# Patient Record
Sex: Male | Born: 1965 | Race: White | Hispanic: No | Marital: Married | Smoking: Never smoker
Health system: Southern US, Community
[De-identification: ages and names within clinical notes are randomized; demographics above are authoritative.]

## PROBLEM LIST (undated history)

## (undated) DIAGNOSIS — Z87442 Personal history of urinary calculi: Secondary | ICD-10-CM

## (undated) DIAGNOSIS — K649 Unspecified hemorrhoids: Secondary | ICD-10-CM

## (undated) DIAGNOSIS — M109 Gout, unspecified: Secondary | ICD-10-CM

## (undated) DIAGNOSIS — I8290 Acute embolism and thrombosis of unspecified vein: Secondary | ICD-10-CM

## (undated) HISTORY — DX: Unspecified hemorrhoids: K64.9

## (undated) HISTORY — DX: Gout, unspecified: M10.9

## (undated) HISTORY — DX: Acute embolism and thrombosis of unspecified vein: I82.90

---

## 2020-04-08 ENCOUNTER — Other Ambulatory Visit: Payer: Self-pay | Admitting: Oncology

## 2020-04-08 DIAGNOSIS — U071 COVID-19: Secondary | ICD-10-CM

## 2020-04-08 NOTE — Progress Notes (Signed)
I connected by phone with Mr. Demonte to discuss the potential use of an new treatment for mild to moderate COVID-19 viral infection in non-hospitalized patients.   This patient is a age/sex that meets the FDA criteria for Emergency Use Authorization of casirivimab\imdevimab.  Has a (+) direct SARS-CoV-2 viral test result 1. Has mild or moderate COVID-19  2. Is ? 54 years of age and weighs ? 40 kg 3. Is NOT hospitalized due to COVID-19 4. Is NOT requiring oxygen therapy or requiring an increase in baseline oxygen flow rate due to COVID-19 5. Is within 10 days of symptom onset 6. Has at least one of the high risk factor(s) for progression to severe COVID-19 and/or hospitalization as defined in EUA. ? Specific high risk criteria :No past medical history on file. ? HIgh risk- Obesity    Symptom onset  04/07/2020   I have spoken and communicated the following to the patient or parent/caregiver:   1. FDA has authorized the emergency use of casirivimab\imdevimab for the treatment of mild to moderate COVID-19 in adults and pediatric patients with positive results of direct SARS-CoV-2 viral testing who are 68 years of age and older weighing at least 40 kg, and who are at high risk for progressing to severe COVID-19 and/or hospitalization.   2. The significant known and potential risks and benefits of casirivimab\imdevimab, and the extent to which such potential risks and benefits are unknown.   3. Information on available alternative treatments and the risks and benefits of those alternatives, including clinical trials.   4. Patients treated with casirivimab\imdevimab should continue to self-isolate and use infection control measures (e.g., wear mask, isolate, social distance, avoid sharing personal items, clean and disinfect "high touch" surfaces, and frequent handwashing) according to CDC guidelines.    5. The patient or parent/caregiver has the option to accept or refuse casirivimab\imdevimab .    After reviewing this information with the patient, The patient agreed to proceed with receiving casirivimab\imdevimab infusion and will be provided a copy of the Fact sheet prior to receiving the infusion.Mignon Pine, AGNP-C [phone] (Infusion Center Hotline)

## 2020-04-09 ENCOUNTER — Other Ambulatory Visit (HOSPITAL_COMMUNITY): Payer: Self-pay

## 2020-04-09 ENCOUNTER — Ambulatory Visit (HOSPITAL_COMMUNITY)
Admission: RE | Admit: 2020-04-09 | Discharge: 2020-04-09 | Disposition: A | Discharge status: Home / Self Care | Payer: HRSA Program | Source: Ambulatory Visit | Attending: Pulmonary Disease | Admitting: Pulmonary Disease

## 2020-04-09 DIAGNOSIS — U071 COVID-19: Secondary | ICD-10-CM | POA: Diagnosis not present

## 2020-04-09 MED ORDER — DIPHENHYDRAMINE HCL 50 MG/ML IJ SOLN: 50.0000 mg | Freq: Once | INTRAMUSCULAR | Status: DC | PRN

## 2020-04-09 MED ORDER — FAMOTIDINE IN NACL 20-0.9 MG/50ML-% IV SOLN: 20.0000 mg | Freq: Once | INTRAVENOUS | Status: DC | PRN

## 2020-04-09 MED ORDER — METHYLPREDNISOLONE SODIUM SUCC 125 MG IJ SOLR: 125.0000 mg | Freq: Once | INTRAMUSCULAR | Status: DC | PRN

## 2020-04-09 MED ORDER — SODIUM CHLORIDE 0.9 % IV SOLN: INTRAVENOUS | Status: DC | PRN

## 2020-04-09 MED ORDER — SODIUM CHLORIDE 0.9 % IV SOLN
1200.0000 mg | Freq: Once | INTRAVENOUS | Status: AC
  Administered 2020-04-09: 1200 mg via INTRAVENOUS

## 2020-04-09 MED ORDER — EPINEPHRINE 0.3 MG/0.3ML IJ SOAJ: 0.3000 mg | Freq: Once | INTRAMUSCULAR | Status: DC | PRN

## 2020-04-09 MED ORDER — ALBUTEROL SULFATE HFA 108 (90 BASE) MCG/ACT IN AERS: 2.0000 | INHALATION_SPRAY | Freq: Once | RESPIRATORY_TRACT | Status: DC | PRN

## 2020-04-09 NOTE — Discharge Instructions (Signed)

## 2020-04-09 NOTE — Progress Notes (Signed)
  Diagnosis: COVID-19  Physician: Dr. Patrick Wright  Procedure: [facility] Med: casirivimab\imdevimab infusion - Provided patient with casirivimab\imdevimab fact sheet for patients, parents and caregivers prior to infusion.  Complications: No immediate complications noted.  Discharge: Discharged home   Ally Yow 04/09/2020   

## 2020-10-01 ENCOUNTER — Other Ambulatory Visit (HOSPITAL_COMMUNITY): Payer: Self-pay | Admitting: Family Medicine

## 2020-10-01 ENCOUNTER — Ambulatory Visit (HOSPITAL_COMMUNITY)
Admission: RE | Admit: 2020-10-01 | Discharge: 2020-10-01 | Disposition: A | Discharge status: Home / Self Care | Payer: Self-pay | Source: Ambulatory Visit | Attending: Family Medicine | Admitting: Family Medicine

## 2020-10-01 ENCOUNTER — Other Ambulatory Visit: Payer: Self-pay

## 2020-10-01 DIAGNOSIS — R609 Edema, unspecified: Secondary | ICD-10-CM

## 2020-10-01 NOTE — Progress Notes (Signed)
Lower extremity venous RT study completed.  Preliminary results relayed to Herbert Marshall. Patient instructed to return to provider office.  See CV Proc for preliminary results report.   Jean Rosenthal, RDMS, RVT

## 2020-10-20 ENCOUNTER — Telehealth: Payer: Self-pay | Admitting: Oncology

## 2020-10-20 NOTE — Telephone Encounter (Signed)
Patient referred by Terrilyn Saver, NP for DVT (Possible Lupus).  Appt made for 11/11/20 Consult 11:00 am

## 2020-11-10 NOTE — Progress Notes (Signed)
[facility]  [address] [place],  [place]  [address] [phone]  Clinic Day:  11/11/2020  Referring physician: [facility]   HISTORY OF PRESENT ILLNESS:  The patient is a 55 y.o. male  who I was asked to consult upon for a right lower extremity DVT.  The patient claims that he noticed sudden right leg swelling that came pronounced over 2-3 days.  This led to a Doppler ultrasound being done in March 2022, which showed clot in his right femoral, proximal profunda femoral, popliteal and gastrocnemius veins.  This led to him being placed on Xarelto, which he continues to take.  He denies having a personal history of clots.  He denies having any trauma or previous surgery to his right leg.  He denies any recent extended travel.  He denies there being a family history of blood clots.  He cannot identify any particular reason as to why he developed a right lower extremity DVT.  PAST MEDICAL HISTORY:   Past Medical History:  Diagnosis Date  . Blood clot in vein   . Gout   . Hemorrhoid     PAST SURGICAL HISTORY:  Left ankle and knee surgeries  CURRENT MEDICATIONS:   Current Outpatient Medications  Medication Sig Dispense Refill  . Colchicine 0.6 MG CAPS Take 1 capsule by mouth daily.    . febuxostat (ULORIC) 40 MG tablet Take 40 mg by mouth daily.    . indomethacin (INDOCIN) 50 MG capsule Take 50 mg by mouth 3 (three) times daily.    Carlena Hurl 20 MG TABS tablet SMARTSIG:1 Tablet(s) By Mouth Every Evening     No current facility-administered medications for this visit.    ALLERGIES:  No Known Allergies  FAMILY HISTORY:  His mother died from an acute cardiovascular event.  His maternal grandfather had lung cancer.  SOCIAL HISTORY:  He was born in [place], [place].  He lives in the [place] community with his wife of 22 years.  He has 2 children and 1 grandchild.  He has been a Conservator, museum/gallery for 7 years.  He drinks socially on the  weekends.  He denies tobacco abuse.    REVIEW OF SYSTEMS:  Review of Systems  Constitutional: Negative for fatigue, fever and unexpected weight change.  Respiratory: Negative for chest tightness, cough, hemoptysis and shortness of breath.   Cardiovascular: Negative for chest pain and palpitations.  Gastrointestinal: Positive for blood in stool (attributed to hemorrhoids). Negative for abdominal distention, abdominal pain, constipation, diarrhea, nausea and vomiting.  Genitourinary: Negative for dysuria, frequency and hematuria.   Musculoskeletal: Positive for arthralgias (arms, neck, back). Negative for back pain and myalgias.  Skin: Positive for rash (right groin). Negative for itching.  Neurological: Negative for dizziness, headaches and light-headedness.  Psychiatric/Behavioral: Negative for depression and suicidal ideas. The patient is not nervous/anxious.      PHYSICAL EXAM:  Blood pressure (!) 170/109, pulse 71, temperature 98.5 F (36.9 C), resp. rate 18, height 6\' 9"  (2.057 m), weight (!) 323 lb 11.2 oz (146.8 kg), SpO2 95 %. Wt Readings from Last 3 Encounters:  11/11/20 (!) 323 lb 11.2 oz (146.8 kg)   Body mass index is 34.69 kg/m. Performance status (ECOG): 0 Physical Exam Constitutional:      Appearance: Normal appearance. He is not ill-appearing.  HENT:     Mouth/Throat:     Mouth: Mucous membranes are moist.     Pharynx: Oropharynx is clear. No oropharyngeal exudate or posterior oropharyngeal  erythema.  Cardiovascular:     Rate and Rhythm: Normal rate and regular rhythm.     Heart sounds: No murmur heard. No friction rub. No gallop.   Pulmonary:     Effort: Pulmonary effort is normal. No respiratory distress.     Breath sounds: Normal breath sounds. No wheezing, rhonchi or rales.  Chest:  Breasts:     Right: No axillary adenopathy or supraclavicular adenopathy.     Left: No axillary adenopathy or supraclavicular adenopathy.    Abdominal:     General: Bowel  sounds are normal. There is no distension.     Palpations: Abdomen is soft. There is no mass.     Tenderness: There is no abdominal tenderness.  Musculoskeletal:        General: No swelling.     Right lower leg: 1+ Edema present.     Left lower leg: No edema.  Lymphadenopathy:     Cervical: No cervical adenopathy.     Upper Body:     Right upper body: No supraclavicular or axillary adenopathy.     Left upper body: No supraclavicular or axillary adenopathy.     Lower Body: No right inguinal adenopathy. No left inguinal adenopathy.  Skin:    General: Skin is warm.     Coloration: Skin is not jaundiced.     Findings: No lesion or rash.  Neurological:     General: No focal deficit present.     Mental Status: He is alert and oriented to person, place, and time. Mental status is at baseline.     Cranial Nerves: Cranial nerves are intact.  Psychiatric:        Mood and Affect: Mood normal.        Behavior: Behavior normal.        Thought Content: Thought content normal.     ASSESSMENT & PLAN:  A 55 y.o. male who I was asked to consult upon for an unprovoked DVT.  A partial hypercoagulable workup has been done, which showed him not to have the factor V Leiden mutation.  His antithrombin III level also came back normal.  A full protein C and S panel was not done. It will be done today.  Prothrombin gene mutation testing will be done today. He did test positive for a lupus inhibitor, which raises the suspicion for antiphospholipid syndrome.  This test can be abnormal when taking Xarelto, but the patient claims he was not on this medicine when his testing was done.  With respect to antiphospholipid syndrome, there are 3 tests used to evaluate for this disorder:  Beta-2 glycoproteins antibodies, anti-Cardiolipin antibodies, and the lupus anticoagulant screen.  All 3 tests will be checked today.  Tests for this disorder have to be positive on 2 separate occasions at least 12 weeks apart from one  another in order for the diagnosis to be made.  He knows to stay on his Xarelto for now.  I will see him back in 3 weeks to go over the results of his hypercoagulable workup.  The patient understands all the plans discussed today and is in agreement with them.  I do appreciate [facility] for his new consult.   Roey Coopman Kirby Funk, MD

## 2020-11-11 ENCOUNTER — Inpatient Hospital Stay: Payer: 59 | Attending: Oncology | Admitting: Oncology

## 2020-11-11 ENCOUNTER — Other Ambulatory Visit: Payer: Self-pay | Admitting: Oncology

## 2020-11-11 ENCOUNTER — Encounter: Payer: Self-pay | Admitting: Oncology

## 2020-11-11 ENCOUNTER — Inpatient Hospital Stay: Payer: 59

## 2020-11-11 ENCOUNTER — Other Ambulatory Visit: Payer: Self-pay

## 2020-11-11 DIAGNOSIS — Z7901 Long term (current) use of anticoagulants: Secondary | ICD-10-CM | POA: Insufficient documentation

## 2020-11-11 DIAGNOSIS — I82411 Acute embolism and thrombosis of right femoral vein: Secondary | ICD-10-CM | POA: Diagnosis present

## 2020-11-11 DIAGNOSIS — I824Y1 Acute embolism and thrombosis of unspecified deep veins of right proximal lower extremity: Secondary | ICD-10-CM

## 2020-11-11 DIAGNOSIS — I82401 Acute embolism and thrombosis of unspecified deep veins of right lower extremity: Secondary | ICD-10-CM | POA: Insufficient documentation

## 2020-11-11 LAB — ANTITHROMBIN III: AntiThromb III Func: 100 % (ref 75–120)

## 2020-11-11 NOTE — Progress Notes (Signed)
Spoke with patient, he was asking about any assistance for his Xarelto. Applied him for the Xarelto co-pay card, he was approved, he is going to stop by and get the information to take to his pharmacy.

## 2020-11-11 NOTE — Progress Notes (Unsigned)
Spoke with Leonie Man @ [facility]) [phone]; authorized CPT [phone] FACTOR V for 1 visit. OACZ#[id number] 11/11/2020 - 02/08/2021

## 2020-11-12 LAB — PROTEIN S, ANTIGEN, FREE: Protein S Ag, Free: 135 % (ref 61–136)

## 2020-11-12 LAB — LUPUS ANTICOAGULANT PANEL
DRVVT: 62.4 s — ABNORMAL HIGH (ref 0.0–47.0)
PTT Lupus Anticoagulant: 34.4 s (ref 0.0–51.9)

## 2020-11-12 LAB — DRVVT MIX: dRVVT Mix: 47.7 s — ABNORMAL HIGH (ref 0.0–40.4)

## 2020-11-12 LAB — PROTEIN S, TOTAL: Protein S Ag, Total: 89 % (ref 60–150)

## 2020-11-12 LAB — DRVVT CONFIRM: dRVVT Confirm: 1.4 ratio — ABNORMAL HIGH (ref 0.8–1.2)

## 2020-11-12 LAB — PROTEIN C ACTIVITY: Protein C Activity: 130 % (ref 73–180)

## 2020-11-12 LAB — PROTEIN S ACTIVITY: Protein S Activity: 131 % (ref 63–140)

## 2020-11-13 LAB — PROTEIN C, TOTAL: Protein C, Total: 104 % (ref 60–150)

## 2020-11-17 LAB — FACTOR 5 LEIDEN

## 2020-11-19 LAB — PROTHROMBIN GENE MUTATION

## 2020-11-28 NOTE — Progress Notes (Signed)
[facility]  [address] [place],  [place]  [address] [phone]  Clinic Day:  12/02/2020  Referring physician: [facility]  This document serves as a record of services personally performed by Weston Settle, MD. It was created on their behalf by Curry,Lauren E, a trained medical scribe. The creation of this record is based on the scribe's personal observations and the provider's statements to them.  HISTORY OF PRESENT ILLNESS:  The patient is a 55 y.o. male  who recently developed an unprovoked right lower extremity DVT in March 2022.  He comes in today to go over his hypercoagulable workup to determine if any underlying clotting disorder is present.  Since his last visit, the patient has been doing well.  However, he does have persistent swelling in his right leg  PHYSICAL EXAM:  Blood pressure (!) 163/102, pulse 78, temperature 98 F (36.7 C), resp. rate 18, height 6\' 9"  (2.057 m), weight (!) 332 lb 4.8 oz (150.7 kg), SpO2 98 %. Wt Readings from Last 3 Encounters:  12/02/20 (!) 332 lb 4.8 oz (150.7 kg)  11/11/20 (!) 323 lb 11.2 oz (146.8 kg)   Body mass index is 35.61 kg/m. Performance status (ECOG): 0 Physical Exam Constitutional:      Appearance: Normal appearance. He is not ill-appearing.  HENT:     Mouth/Throat:     Mouth: Mucous membranes are moist.     Pharynx: Oropharynx is clear. No oropharyngeal exudate or posterior oropharyngeal erythema.  Cardiovascular:     Rate and Rhythm: Normal rate and regular rhythm.     Heart sounds: No murmur heard. No friction rub. No gallop.   Pulmonary:     Effort: Pulmonary effort is normal. No respiratory distress.     Breath sounds: Normal breath sounds. No wheezing, rhonchi or rales.  Chest:  Breasts:     Right: No axillary adenopathy or supraclavicular adenopathy.     Left: No axillary adenopathy or supraclavicular adenopathy.    Abdominal:     General: Bowel sounds are  normal. There is no distension.     Palpations: Abdomen is soft. There is no mass.     Tenderness: There is no abdominal tenderness.  Musculoskeletal:        General: No swelling.     Right lower leg: 1+ Edema (trace ) present.     Left lower leg: No edema.  Lymphadenopathy:     Cervical: No cervical adenopathy.     Upper Body:     Right upper body: No supraclavicular or axillary adenopathy.     Left upper body: No supraclavicular or axillary adenopathy.     Lower Body: No right inguinal adenopathy. No left inguinal adenopathy.  Skin:    General: Skin is warm.     Coloration: Skin is not jaundiced.     Findings: Burn (suntan/sunburn over all extremities) present. No lesion or rash.  Neurological:     General: No focal deficit present.     Mental Status: He is alert and oriented to person, place, and time. Mental status is at baseline.     Cranial Nerves: Cranial nerves are intact.  Psychiatric:        Mood and Affect: Mood normal.        Behavior: Behavior normal.        Thought Content: Thought content normal.    LABS:   Ref Range & Units 2 wk ago  Protein S Ag, Total 60 -  150 % 89     Ref Range & Units 2 wk ago  Protein S Ag, Free 61 - 136 % 135     Ref Range & Units 2 wk ago  Protein S Activity 63 - 140 % 131       Ref Range & Units 2 wk ago  Protein C, Total 60 - 150 % 104     Ref Range & Units 2 wk ago  Protein C Activity 73 - 180 % 130     Ref Range & Units 2 wk ago  PTT Lupus Anticoagulant 0.0 - 51.9 sec 34.4   DRVVT 0.0 - 47.0 sec 62.4High     Ref Range & Units 2 wk ago  dRVVT Mix 0.0 - 40.4 sec 47.7High     Ref Range & Units 2 wk ago  dRVVT Confirm 0.8 - 1.2 ratio 1.4High        Ref. Range 11/11/2020 12:09  Antithrombin Activity Latest Ref Range: 75 - 120 % 100     Ref. Range 12/02/2020 15:54  Anticardiolipin Ab,IgA,Qn Latest Ref Range: 0 - 11 APL U/mL <9  Anticardiolipin Ab,IgG,Qn Latest Ref Range: 0 - 14 GPL U/mL <9  Anticardiolipin  Ab,IgM,Qn Latest Ref Range: 0 - 12 MPL U/mL <9  Beta-2 Glycoprotein I Ab, IgG Latest Ref Range: 0 - 20 GPI IgG units <9  Beta-2-Glycoprotein I IgA Latest Ref Range: 0 - 25 GPI IgA units <9  Beta-2-Glycoprotein I IgM Latest Ref Range: 0 - 32 GPI IgM units <9    ASSESSMENT & PLAN:  A 55 y.o. male who developed an unprovoked DVT.  Based upon recent labs, he does not have protein C deficiency, protein S deficiency, factor V Leiden mutation, prothrombin gene mutation, or antithrombin deficiency.  With respect to having antiphospholipid syndrome, his lupus anticoagulant screen came back positive, but this test was done while on his Xarelto, which leads to false positive results.  The other 2 tests to evaluate for antiphospholipid syndrome (beta-2 glycoprotein and Cardiolipin antibodies) came back completely negative.  Overall, I do not get the sense he has an underlying hypercoagulable disorder.  Despite this, the fact that his DVT in his right leg was extensive and unprovoked has me to believe he needs to be on lifelong anticoagulation.  The only concern the patient has is the cost of Xarelto.  From my standpoint, I would not have a problem with him being on warfarin if he is unable to get ideal financial coverage for either Xarelto or Eliquis.  He understands the plan is indefinite anticoagulation, irrespective of the agent being used.  As he has no other pressing hematologic issues, I will turn his care back over to his other physicians.  I would not have a problem seeing him in the future if new hematologic issues arise that require repeat clinical assessment.  The patient understands all the plans discussed today and is in agreement with them.   I, Foye Deer, am acting as scribe for Weston Settle, MD    I have reviewed this report as typed by the medical scribe, and it is complete and accurate.  Dequincy Kirby Funk, MD

## 2020-12-02 ENCOUNTER — Inpatient Hospital Stay: Payer: 59 | Attending: Oncology | Admitting: Oncology

## 2020-12-02 ENCOUNTER — Other Ambulatory Visit: Payer: Self-pay

## 2020-12-02 ENCOUNTER — Other Ambulatory Visit: Payer: Self-pay | Admitting: Oncology

## 2020-12-02 ENCOUNTER — Inpatient Hospital Stay: Payer: 59

## 2020-12-02 DIAGNOSIS — I824Y1 Acute embolism and thrombosis of unspecified deep veins of right proximal lower extremity: Secondary | ICD-10-CM | POA: Diagnosis not present

## 2020-12-02 DIAGNOSIS — I82411 Acute embolism and thrombosis of right femoral vein: Secondary | ICD-10-CM | POA: Insufficient documentation

## 2020-12-04 LAB — BETA-2-GLYCOPROTEIN I ABS, IGG/M/A
Beta-2 Glyco I IgG: <9 GPI IgG units (ref 0–20)
Beta-2-Glycoprotein I IgA: <9 GPI IgA units (ref 0–25)
Beta-2-Glycoprotein I IgM: <9 GPI IgM units (ref 0–32)

## 2020-12-04 LAB — CARDIOLIPIN ANTIBODIES, IGG, IGM, IGA
Anticardiolipin IgA: <9 APL U/mL (ref 0–11)
Anticardiolipin IgG: <9 GPL U/mL (ref 0–14)
Anticardiolipin IgM: <9 MPL U/mL (ref 0–12)

## 2020-12-05 ENCOUNTER — Telehealth: Payer: Self-pay

## 2020-12-05 ENCOUNTER — Telehealth: Payer: Self-pay | Admitting: Oncology

## 2020-12-05 NOTE — Telephone Encounter (Signed)
No LOS Entered 

## 2020-12-05 NOTE — Telephone Encounter (Addendum)
@  1554- I spoke with pt. I told him that Dr Melvyn Neth said the antibody test results were both negative. Therefore, he does not have antiphospholipid syndrome. Pt will require lifelong anticoagulant therapy. Pt verbalized understanding. Pt is taking Xarelto @ present, but mentions that it is very expensive. He would like to know if there is something different he could try. I told him I would send a message to Mady Haagensen to see if she could help with application for free med or reduced co pay.   Pt called to get results of lab work from earlier in the week. I notified Dr Melvyn Neth @1436 . He will review in a moment.

## 2021-06-02 ENCOUNTER — Telehealth: Payer: Self-pay

## 2021-06-02 ENCOUNTER — Telehealth: Payer: Self-pay | Admitting: Hematology and Oncology

## 2021-06-02 ENCOUNTER — Other Ambulatory Visit: Payer: Self-pay | Admitting: Oncology

## 2021-06-02 DIAGNOSIS — I82441 Acute embolism and thrombosis of right tibial vein: Secondary | ICD-10-CM

## 2021-06-02 NOTE — Telephone Encounter (Signed)
@  1510 -I notified Dr Melvyn Neth of pt's report. Dr Melvyn Neth states pt needs to see his PCP for the blood in urine. He will order a doppler for the right inguinal area since pt is feeling swollen in that area. Pt also mentions he just returned from out of [place] and was on airplane.  Pt remains on Xarelto qd.   @ 1518- I notified Rinaldo Ratel that Dr Melvyn Neth is ordering doppler, so she can get it scheduled. I told her pt is still sitting in old shot room. She asks that he stay there and once she gets appt for him, she will take it to him.  @ 1525- I went back and talked to Herbert Marshall and told him that Dr Melvyn Neth recommended a doppler to make sure no development of new clot. I told him schedulers are working on it and will then bring him appt. I, again, told him that Dr Melvyn Neth wants him to see his PCP for the blood in urine. Dr Melvyn Neth states, "the Xarelto would not cause the blood in urine. His PCP made need to refer him to urologist".  When I asked pt about the blood in urine and whether or not it is all blood or some blood in urine. Pt replied, "the blood in urine tends to clear up, the more water I drink. When I get up in the mornings, the blood starts again". Pt does admit to lower back pain. I asked if he ever had kidney stones. He replied, No".

## 2021-06-02 NOTE — Telephone Encounter (Signed)
Received call from [facility] on call. Patient had right lower extremity ultrasound that is positive for DVT. Contacted patient and he states he continues Xarelto 20 mg daily. Advised him we would recommend changing to Lovenox injections as he had a DVT while on his blood thinner. He states he is not going to do that. He asked about taking more Xarelto. I advised him that I would not recommend that. He is on the standard dose after initial loading dose. He was willing to come into clinic tomorrow to discuss further. Advised him he could develop blood clot in lung so if he develops sudden shortness of breath or chest pain he should come to the ER. Appointment scheduled for 11/16 at 11am. Patient expresses understanding.

## 2021-06-03 ENCOUNTER — Inpatient Hospital Stay: Payer: 59 | Attending: Hematology and Oncology | Admitting: Hematology and Oncology

## 2021-06-03 ENCOUNTER — Encounter: Payer: Self-pay | Admitting: Hematology and Oncology

## 2021-06-03 ENCOUNTER — Other Ambulatory Visit: Payer: Self-pay

## 2021-06-03 DIAGNOSIS — I82411 Acute embolism and thrombosis of right femoral vein: Secondary | ICD-10-CM

## 2021-06-03 NOTE — Assessment & Plan Note (Signed)
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 His symptoms are already improving. We recommended changing his anticoagulation to enoxaparin injections but the patient is unwilling to do injections at home at this time. He understands the risk for pulmonary embolism and knows to seek emergency care if he develops sudden or progressive shortness of breath or chest pain. He will continue rivaroxaban 20 mg daily. We will plan to see him back in 3 months for reexamination.
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
## 2021-06-04 ENCOUNTER — Other Ambulatory Visit: Payer: Self-pay | Admitting: Hematology and Oncology

## 2021-06-04 ENCOUNTER — Other Ambulatory Visit: Payer: Self-pay

## 2021-06-04 ENCOUNTER — Inpatient Hospital Stay: Payer: 59

## 2021-06-04 DIAGNOSIS — I82411 Acute embolism and thrombosis of right femoral vein: Secondary | ICD-10-CM

## 2021-06-04 LAB — CBC AND DIFFERENTIAL
HCT: 50 (ref 41–53)
Hemoglobin: 17.7 — AB (ref 13.5–17.5)
Neutrophils Absolute: 4.15
Platelets: 193 (ref 150–399)
WBC: 6.8

## 2021-06-04 LAB — BASIC METABOLIC PANEL
BUN: 17 (ref 4–21)
CO2: 26 — AB (ref 13–22)
Chloride: 105 (ref 99–108)
Creatinine: 1.1 (ref 0.6–1.3)
Glucose: 110
Potassium: 4.3 (ref 3.4–5.3)
Sodium: 139 (ref 137–147)

## 2021-06-04 LAB — COMPREHENSIVE METABOLIC PANEL
Albumin: 4.4 (ref 3.5–5.0)
Calcium: 9 (ref 8.7–10.7)

## 2021-06-04 LAB — HEPATIC FUNCTION PANEL
ALT: 98 — AB (ref 10–40)
AST: 76 — AB (ref 14–40)
Alkaline Phosphatase: 58 (ref 25–125)
Bilirubin, Total: 1.2

## 2021-06-04 LAB — CBC: RBC: 5.13 — AB (ref 3.87–5.11)

## 2021-06-06 ENCOUNTER — Emergency Department (HOSPITAL_COMMUNITY)
Admission: EM | Admit: 2021-06-06 | Discharge: 2021-06-06 | Disposition: A | Discharge status: Home / Self Care | Payer: 59 | Attending: Emergency Medicine | Admitting: Emergency Medicine

## 2021-06-06 ENCOUNTER — Emergency Department (HOSPITAL_COMMUNITY): Payer: 59

## 2021-06-06 ENCOUNTER — Other Ambulatory Visit: Payer: Self-pay

## 2021-06-06 ENCOUNTER — Encounter (HOSPITAL_COMMUNITY): Payer: Self-pay | Admitting: Emergency Medicine

## 2021-06-06 DIAGNOSIS — Z7901 Long term (current) use of anticoagulants: Secondary | ICD-10-CM | POA: Diagnosis not present

## 2021-06-06 DIAGNOSIS — S299XXA Unspecified injury of thorax, initial encounter: Secondary | ICD-10-CM | POA: Diagnosis present

## 2021-06-06 DIAGNOSIS — R0789 Other chest pain: Secondary | ICD-10-CM

## 2021-06-06 DIAGNOSIS — Y92410 Unspecified street and highway as the place of occurrence of the external cause: Secondary | ICD-10-CM | POA: Diagnosis not present

## 2021-06-06 DIAGNOSIS — S20212A Contusion of left front wall of thorax, initial encounter: Secondary | ICD-10-CM | POA: Insufficient documentation

## 2021-06-06 DIAGNOSIS — S8012XA Contusion of left lower leg, initial encounter: Secondary | ICD-10-CM | POA: Insufficient documentation

## 2021-06-06 LAB — BASIC METABOLIC PANEL
Anion gap: 12 (ref 5–15)
BUN: 13 mg/dL (ref 6–20)
CO2: 26 mmol/L (ref 22–32)
Calcium: 9.3 mg/dL (ref 8.9–10.3)
Chloride: 99 mmol/L (ref 98–111)
Creatinine, Ser: 1.2 mg/dL (ref 0.61–1.24)
GFR, Estimated: >60 mL/min (ref >60)
Glucose, Bld: 106 mg/dL — ABNORMAL HIGH (ref 70–99)
Potassium: 4 mmol/L (ref 3.5–5.1)
Sodium: 137 mmol/L (ref 135–145)

## 2021-06-06 LAB — CBC
HCT: 50 % (ref 39.0–52.0)
Hemoglobin: 17.3 g/dL — ABNORMAL HIGH (ref 13.0–17.0)
MCH: 34.1 pg — ABNORMAL HIGH (ref 26.0–34.0)
MCHC: 34.6 g/dL (ref 30.0–36.0)
MCV: 98.4 fL (ref 80.0–100.0)
Platelets: 219 10*3/uL (ref 150–400)
RBC: 5.08 MIL/uL (ref 4.22–5.81)
RDW: 11.9 % (ref 11.5–15.5)
WBC: 13.8 10*3/uL — ABNORMAL HIGH (ref 4.0–10.5)
nRBC: 0 % (ref 0.0–0.2)

## 2021-06-06 LAB — TROPONIN I (HIGH SENSITIVITY): Troponin I (High Sensitivity): 6 ng/L (ref <18)

## 2021-06-06 MED ORDER — IBUPROFEN 800 MG PO TABS: 800.0000 mg | ORAL_TABLET | Freq: Three times a day (TID) | ORAL | 0 refills | Status: DC

## 2021-06-06 MED ORDER — CYCLOBENZAPRINE HCL 10 MG PO TABS: 10.0000 mg | ORAL_TABLET | Freq: Two times a day (BID) | ORAL | 0 refills | Status: AC | PRN
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
## 2021-06-17 ENCOUNTER — Encounter: Payer: Self-pay | Admitting: Oncology

## 2021-08-28 NOTE — Progress Notes (Incomplete)
Herbert Marshall  [address] [place],  [name]  [address] [id number]  Clinic Day:  08/28/2021  Referring physician: [place]*  This document serves as a record of services personally performed by Herbert Potter, MD. It was created on their behalf by Curry,Lauren E, a trained medical scribe. The creation of this record is based on the scribe's personal observations and the provider's statements to them.  HISTORY OF PRESENT ILLNESS:  The patient is a 56 y.o. male  who recently developed an unprovoked right lower extremity DVT in March 2022.  Based upon labs done in May 2022, he does not have protein C deficiency, protein S deficiency, factor V Leiden mutation, prothrombin gene mutation, or antithrombin deficiency.  With respect to having antiphospholipid syndrome, his lupus anticoagulant screen came back positive, but this test was done while on rivaroxaban, which leads to false positive results.  The other 2 tests to evaluate for antiphospholipid syndrome (beta-2 glycoprotein and Cardiolipin antibodies) came back completely negative.  We, therefore, did not feel he has an underlying hypercoagulable disorder.  Despite this, as his DVT in his right leg was extensive and unprovoked, we recommended lifelong anticoagulation. He opted to continue rivaroxaban 20 mg daily. 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 We recommended changing his anticoagulation to enoxaparin injections but the patient was unwilling to do injections at home at that time.   PHYSICAL EXAM:  There were no vitals taken for this visit. Wt Readings from Last 3 Encounters:  06/03/21 (!) 335 lb (152 kg)  12/02/20 (!) 332 lb 4.8 oz (150.7 kg)  11/11/20 (!) 323 lb 11.2 oz (146.8 kg)   There is no height or weight on file to calculate  BMI. Performance status (ECOG): 0 Physical Exam Constitutional:      Appearance: Normal appearance. He is not ill-appearing.  HENT:     Mouth/Throat:     Mouth: Mucous membranes are moist.     Pharynx: Oropharynx is clear. No oropharyngeal exudate or posterior oropharyngeal erythema.  Cardiovascular:     Rate and Rhythm: Normal rate and regular rhythm.     Heart sounds: No murmur heard.   No friction rub. No gallop.  Pulmonary:     Effort: Pulmonary effort is normal. No respiratory distress.     Breath sounds: Normal breath sounds. No wheezing, rhonchi or rales.  Abdominal:     General: Bowel sounds are normal. There is no distension.     Palpations: Abdomen is soft. There is no mass.     Tenderness: There is no abdominal tenderness.  Musculoskeletal:        General: No swelling.     Right lower leg: 1+ Edema (trace ) present.     Left lower leg: No edema.  Lymphadenopathy:     Cervical: No cervical adenopathy.     Upper Body:     Right upper body: No supraclavicular or axillary adenopathy.     Left upper body: No supraclavicular or axillary adenopathy.     Lower Body: No right inguinal adenopathy. No left inguinal adenopathy.  Skin:    General: Skin is warm.     Coloration: Skin is not jaundiced.     Findings: Burn (suntan/sunburn over all extremities) present. No lesion or rash.  Neurological:     General: No focal deficit present.  Mental Status: He is alert and oriented to person, place, and time. Mental status is at baseline.  Psychiatric:        Mood and Affect: Mood normal.        Behavior: Behavior normal.        Thought Content: Thought content normal.   ASSESSMENT & PLAN:  A 56 y.o. male who developed an unprovoked DVT. He understands the plan is indefinite anticoagulation, irrespective of the agent being used.  As he has no other pressing hematologic issues, I will turn his care back over to his other physicians.  I would not have a problem seeing him in the  future if new hematologic issues arise that require repeat clinical assessment.  The patient understands all the plans discussed today and is in agreement with them.   I, Rita Ohara, am acting as scribe for Herbert Potter, MD    I have reviewed this report as typed by the medical scribe, and it is complete and accurate.  Dequincy Macarthur Critchley, MD

## 2021-09-03 ENCOUNTER — Ambulatory Visit: Payer: 59 | Admitting: Oncology

## 2021-10-06 ENCOUNTER — Encounter: Payer: Self-pay | Admitting: *Deleted

## 2021-12-18 ENCOUNTER — Ambulatory Visit: Payer: Self-pay | Admitting: Urology

## 2021-12-18 ENCOUNTER — Encounter: Payer: Self-pay | Admitting: Urology

## 2021-12-18 VITALS — BP 138/95 | HR 70 | Ht >= 80 in | Wt 320.0 lb

## 2021-12-18 DIAGNOSIS — R310 Gross hematuria: Secondary | ICD-10-CM

## 2021-12-18 DIAGNOSIS — N400 Enlarged prostate without lower urinary tract symptoms: Secondary | ICD-10-CM

## 2021-12-18 DIAGNOSIS — N401 Enlarged prostate with lower urinary tract symptoms: Secondary | ICD-10-CM

## 2021-12-18 LAB — MICROSCOPIC EXAMINATION: WBC, UA: >30 /hpf — AB (ref 0–5)

## 2021-12-18 LAB — URINALYSIS, COMPLETE
Bilirubin, UA: NEGATIVE
Glucose, UA: NEGATIVE
Ketones, UA: NEGATIVE
Nitrite, UA: NEGATIVE
Specific Gravity, UA: 1.02 (ref 1.005–1.030)
Urobilinogen, Ur: 0.2 mg/dL (ref 0.2–1.0)
pH, UA: 6.5 (ref 5.0–7.5)

## 2021-12-18 LAB — BLADDER SCAN AMB NON-IMAGING: Scan Result: 34

## 2021-12-18 NOTE — Progress Notes (Signed)
12/18/2021 12:50 PM   Herbert Marshall Mar 04, 1966 [id number]  Referring provider: Lars Mage, NP [address] [place],  [place] [address]  Chief Complaint  Patient presents with   Benign Prostatic Hypertrophy    HPI: Herbert Marshall is a 56 y.o. referred for prostatitis and hematuria.  6 month history of intermittent gross hematuria.  States worse with prolonged sitting Seen by PCP March 2023 and was on Cipro for dysuria.  I cannot tell from the notes if he had a positive urine culture On Xarelto for DVT Presently notes malodorous urine and lower abdominal discomfort. Does have some urgency and intermittent urinary stream IPSS today 8/35 No prior history stone disease   PMH: Past Medical History:  Diagnosis Date   Blood clot in vein    Gout    Hemorrhoid     Surgical History: Past Surgical History:  Procedure Laterality Date   ANKLE SURGERY Left    HEMORRHOID SURGERY     KNEE SURGERY Right     Home Medications:  Allergies as of 12/18/2021   No Known Allergies      Medication List        Accurate as of December 18, 2021 12:50 PM. If you have any questions, ask your nurse or doctor.          Colchicine 0.6 MG Caps Take 1 capsule by mouth daily.   cyclobenzaprine 10 MG tablet Commonly known as: FLEXERIL Take 1 tablet (10 mg total) by mouth 2 (two) times daily as needed for muscle spasms.   febuxostat 40 MG tablet Commonly known as: ULORIC Take 40 mg by mouth daily.   ibuprofen 800 MG tablet Commonly known as: ADVIL Take 1 tablet (800 mg total) by mouth 3 (three) times daily.   indomethacin 50 MG capsule Commonly known as: INDOCIN Take 50 mg by mouth 3 (three) times daily.   Xarelto 20 MG Tabs tablet Generic drug: rivaroxaban SMARTSIG:1 Tablet(s) By Mouth Every Evening        Allergies: No Known Allergies  Family History: No family history on file.  Social History:  reports that he has never smoked. He has never used smokeless  tobacco. He reports current alcohol use. He reports that he does not currently use drugs.   Physical Exam: BP (!) 138/95   Pulse 70   Ht 6\' 9"  (2.057 m)   Wt (!) 320 lb (145.2 kg)   BMI 34.29 kg/m   Constitutional:  Alert and oriented, No acute distress. HEENT: [place] AT, moist mucus membranes.  Trachea midline, no masses. Cardiovascular: No clubbing, cyanosis, or edema. Respiratory: Normal respiratory effort, no increased work of breathing. GU: Declined DRE Psychiatric: Normal mood and affect.  Laboratory Data:  Urinalysis Microscopy >30 WBC/6-10 RBC/moderate bacteria   Assessment & Plan:    1.  BPH with LUTS PVR today 34 mL Voiding symptoms are not bothersome enough that he desires medical management  2.  Gross hematuria UA today with pyuria and urine culture ordered I discussed the recommended evaluation for gross hematuria which includes a CT urogram and cystoscopy.  We discussed that urinary tract malignancies can be a cause of recurrent gross hematuria and the after mentioned studies are to evaluate for both benign and malignant causes of hematuria.  He declined to proceed with the recommended evaluation and stated he was willing to accept the risk of a missed urologic malignancy   , MD  [facility] [address], [address]  [place], [place] [address] [phone]

## 2021-12-20 ENCOUNTER — Encounter: Payer: Self-pay | Admitting: Urology

## 2021-12-23 LAB — CULTURE, URINE COMPREHENSIVE

## 2021-12-25 ENCOUNTER — Other Ambulatory Visit: Payer: Self-pay | Admitting: *Deleted

## 2021-12-25 ENCOUNTER — Telehealth: Payer: Self-pay | Admitting: *Deleted

## 2021-12-25 DIAGNOSIS — R310 Gross hematuria: Secondary | ICD-10-CM

## 2021-12-25 MED ORDER — CEFUROXIME AXETIL 500 MG PO TABS: 500.0000 mg | ORAL_TABLET | Freq: Two times a day (BID) | ORAL | 0 refills | Status: AC

## 2021-12-25 NOTE — Telephone Encounter (Signed)
Urine culture was positive for infection.  Send in Rx cefuroxime 500 mg twice daily x10 days.  Recommend lab visit for follow-up UA in 1 month   Notified patient as instructed, patient pleased. Discussed follow-up appointments, patient agrees

## 2021-12-25 NOTE — Telephone Encounter (Signed)
-----   Message from Riki Altes, MD sent at 12/25/2021  7:11 AM EDT ----- Urine culture was positive for infection.  Send in Rx cefuroxime 500 mg twice daily x10 days.  Recommend lab visit for follow-up UA in 1 month

## 2022-01-25 ENCOUNTER — Other Ambulatory Visit: Payer: Self-pay

## 2022-01-25 DIAGNOSIS — R310 Gross hematuria: Secondary | ICD-10-CM

## 2022-01-26 LAB — MICROSCOPIC EXAMINATION: WBC, UA: >30 /hpf — ABNORMAL HIGH (ref 0–5)

## 2022-01-26 LAB — URINALYSIS, COMPLETE
Bilirubin, UA: NEGATIVE
Glucose, UA: NEGATIVE
Ketones, UA: NEGATIVE
Nitrite, UA: NEGATIVE
Specific Gravity, UA: 1.02 (ref 1.005–1.030)
Urobilinogen, Ur: 0.2 mg/dL (ref 0.2–1.0)
pH, UA: 7 (ref 5.0–7.5)

## 2022-01-28 LAB — CULTURE, URINE COMPREHENSIVE

## 2022-01-29 ENCOUNTER — Telehealth: Payer: Self-pay | Admitting: Urology

## 2022-01-29 DIAGNOSIS — R310 Gross hematuria: Secondary | ICD-10-CM

## 2022-01-29 DIAGNOSIS — R8271 Bacteriuria: Secondary | ICD-10-CM

## 2022-01-29 NOTE — Telephone Encounter (Signed)
Repeat UA showed persistent abnormalities and culture growing bacteria.  I had recommended at her office visit that he undergo CT urogram and cystoscopy and he declined.  Due to persistent abnormalities recommend these procedures be scheduled.

## 2022-01-29 NOTE — Telephone Encounter (Signed)
Patient notified and has agreed to the CT scan and Cysto. Cysto has been scheduled.

## 2022-02-10 ENCOUNTER — Ambulatory Visit
Admission: RE | Admit: 2022-02-10 | Discharge: 2022-02-10 | Disposition: A | Discharge status: Home / Self Care | Payer: 59 | Source: Ambulatory Visit | Attending: Urology | Admitting: Urology

## 2022-02-10 DIAGNOSIS — R310 Gross hematuria: Secondary | ICD-10-CM | POA: Insufficient documentation

## 2022-02-10 DIAGNOSIS — R8271 Bacteriuria: Secondary | ICD-10-CM | POA: Insufficient documentation

## 2022-02-10 MED ORDER — IOHEXOL 300 MG/ML  SOLN
125.0000 mL | Freq: Once | INTRAMUSCULAR | Status: AC | PRN
  Administered 2022-02-10: 125 mL via INTRAVENOUS

## 2022-02-12 ENCOUNTER — Telehealth: Payer: Self-pay | Admitting: *Deleted

## 2022-02-12 NOTE — Telephone Encounter (Signed)
-----   Message from Riki Altes, MD sent at 02/12/2022  7:20 AM EDT ----- CT showed a large stone in the left ureter and thickening of the left ureter.  Please schedule office visit to discuss these findings.  Can cancel his cystoscopy appointment 03/11/2022

## 2022-02-12 NOTE — Telephone Encounter (Signed)
Notified patient as instructed, patient pleased.Appt made 

## 2022-02-19 ENCOUNTER — Ambulatory Visit: Payer: Commercial Managed Care - HMO | Admitting: Urology

## 2022-02-19 ENCOUNTER — Encounter: Payer: Self-pay | Admitting: Urology

## 2022-02-19 VITALS — BP 140/102 | HR 61 | Ht >= 80 in | Wt 320.0 lb

## 2022-02-19 DIAGNOSIS — N202 Calculus of kidney with calculus of ureter: Secondary | ICD-10-CM | POA: Diagnosis not present

## 2022-02-19 DIAGNOSIS — R310 Gross hematuria: Secondary | ICD-10-CM | POA: Diagnosis not present

## 2022-02-19 DIAGNOSIS — N201 Calculus of ureter: Secondary | ICD-10-CM

## 2022-02-19 DIAGNOSIS — N390 Urinary tract infection, site not specified: Secondary | ICD-10-CM

## 2022-02-19 DIAGNOSIS — N200 Calculus of kidney: Secondary | ICD-10-CM

## 2022-02-19 DIAGNOSIS — N400 Enlarged prostate without lower urinary tract symptoms: Secondary | ICD-10-CM

## 2022-02-19 LAB — URINALYSIS, COMPLETE
Bilirubin, UA: NEGATIVE
Glucose, UA: NEGATIVE
Ketones, UA: NEGATIVE
Nitrite, UA: POSITIVE — AB
Protein,UA: NEGATIVE
RBC, UA: NEGATIVE
Specific Gravity, UA: 1.015 (ref 1.005–1.030)
Urobilinogen, Ur: 0.2 mg/dL (ref 0.2–1.0)
pH, UA: 7 (ref 5.0–7.5)

## 2022-02-19 LAB — MICROSCOPIC EXAMINATION: Epithelial Cells (non renal): NONE SEEN /hpf (ref 0–10)

## 2022-02-19 MED ORDER — AMOXICILLIN 875 MG PO TABS: 875.0000 mg | ORAL_TABLET | Freq: Two times a day (BID) | ORAL | 0 refills | Status: AC

## 2022-02-19 NOTE — H&P (View-Only) (Signed)
02/19/2022 11:19 AM   Herbert Marshall [birth year] [id number]  Referring provider: [facility] [address] [place],  [place] [address]  Chief Complaint  Patient presents with   Nephrolithiasis    HPI: 56 y.o. for follow-up to discuss CT results  Initially seen 12/18/2021 with a 6 month history of intermittent gross hematuria.   On Xarelto for DVT Urine cultures positive for E. coli. CTU and cystoscopy was recommended.  CTU showed nonobstructing left renal calculi and a 6 mm left distal ureteral calculus with distal ureteral dilation which extended distal to the stone His cystoscopy was canceled and he was asked to come in to discuss ureteroscopy. Hematuria and voiding symptoms resolved after his last antibiotic treatment but returned approximately 4 days after completing the course. Denies fever or chills   PMH: Past Medical History:  Diagnosis Date   Blood clot in vein    Gout    Hemorrhoid     Surgical History: Past Surgical History:  Procedure Laterality Date   ANKLE SURGERY Left    HEMORRHOID SURGERY     KNEE SURGERY Right     Home Medications:  Allergies as of 02/19/2022   No Known Allergies      Medication List        Accurate as of February 19, 2022 11:19 AM. If you have any questions, ask your nurse or doctor.          Colchicine 0.6 MG Caps Take 1 capsule by mouth daily.   cyclobenzaprine 10 MG tablet Commonly known as: FLEXERIL Take 1 tablet (10 mg total) by mouth 2 (two) times daily as needed for muscle spasms.   febuxostat 40 MG tablet Commonly known as: ULORIC Take 40 mg by mouth daily.   ibuprofen 800 MG tablet Commonly known as: ADVIL Take 1 tablet (800 mg total) by mouth 3 (three) times daily.   indomethacin 50 MG capsule Commonly known as: INDOCIN Take 50 mg by mouth 3 (three) times daily.   Xarelto 20 MG Tabs tablet Generic drug: rivaroxaban SMARTSIG:1 Tablet(s) By Mouth Every Evening        Allergies:  No Known Allergies  Family History: History reviewed. No pertinent family history.  Social History:  reports that he has never smoked. He has never used smokeless tobacco. He reports current alcohol use. He reports that he does not currently use drugs.   Physical Exam: BP (!) 140/102   Pulse 61   Ht 6\' 9"  (2.057 m)   Wt (!) 320 lb (145.2 kg)   BMI 34.29 kg/m   Constitutional:  Alert and oriented, No acute distress. HEENT: [place] AT, moist mucus membranes.  Trachea midline, no masses. Cardiovascular: No clubbing, cyanosis, or edema. Respiratory: Normal respiratory effort, no increased work of breathing. Psychiatric: Normal mood and affect.  Laboratory Data:  Urinalysis Microscopy 11-30 WBC/many bacteria   Assessment & Plan:    1.  Left ureteral calculus/renal calculi We discussed various treatment options for urolithiasis including observation with or without medical expulsive therapy, shockwave lithotripsy (SWL), ureteroscopy and laser lithotripsy with stent placement. We discussed that management is based on stone size, location, density, patient co-morbidities, and patient preference.  SWL has a lower stone free rate in a single procedure, but also a lower complication rate compared to ureteroscopy and avoids a stent and associated stent related symptoms. Possible complications include renal hematoma, steinstrasse, and need for additional treatment. Ureteroscopy with laser lithotripsy and stent placement has a higher stone free rate  than SWL in a single procedure, however increased complication rate including possible infection, ureteral injury, bleeding, and stent related morbidity. Common stent related symptoms include dysuria, urgency/frequency, and flank pain. He would like to proceed with ureteroscopy.  We discussed in a small percentage of cases the calculi cannot be treated due to inability to access the ureter with ureteroscope.  If this were to occur he would need stent  placement and either follow-up ureteroscopy after a period of passive stent dilation or shockwave lithotripsy We will need clearance to hold Xarelto  2.  Recurrent UTI Recurrent E. coli bacteriuria.  Since not a typical urease producing organism treatment of his calculi may not resolve his infections He is symptomatic and urine culture was ordered. Rx amoxicillin 875 mg sent to pharmacy.  We will keep on low-dose prophylaxis after course complete until his surgery date  3.  Recurrent gross hematuria Most likely multifactorial-UTI, urinary tract calculi and anticoagulation Thorough cystoscopy will be performed at time of ureteroscopy    Nabila Albarracin C Addilyne Backs, MD  [facility] [address], [address] [place], [place] [address] [phone] 

## 2022-02-19 NOTE — Progress Notes (Signed)
02/19/2022 11:19 AM   Herbert Marshall [birth year] [id number]  Referring provider: [facility] [address] [place],  [place] [address]  Chief Complaint  Patient presents with   Nephrolithiasis    HPI: 56 y.o. for follow-up to discuss CT results  Initially seen 12/18/2021 with a 6 month history of intermittent gross hematuria.   On Xarelto for DVT Urine cultures positive for E. coli. CTU and cystoscopy was recommended.  CTU showed nonobstructing left renal calculi and a 6 mm left distal ureteral calculus with distal ureteral dilation which extended distal to the stone His cystoscopy was canceled and he was asked to come in to discuss ureteroscopy. Hematuria and voiding symptoms resolved after his last antibiotic treatment but returned approximately 4 days after completing the course. Denies fever or chills   PMH: Past Medical History:  Diagnosis Date   Blood clot in vein    Gout    Hemorrhoid     Surgical History: Past Surgical History:  Procedure Laterality Date   ANKLE SURGERY Left    HEMORRHOID SURGERY     KNEE SURGERY Right     Home Medications:  Allergies as of 02/19/2022   No Known Allergies      Medication List        Accurate as of February 19, 2022 11:19 AM. If you have any questions, ask your nurse or doctor.          Colchicine 0.6 MG Caps Take 1 capsule by mouth daily.   cyclobenzaprine 10 MG tablet Commonly known as: FLEXERIL Take 1 tablet (10 mg total) by mouth 2 (two) times daily as needed for muscle spasms.   febuxostat 40 MG tablet Commonly known as: ULORIC Take 40 mg by mouth daily.   ibuprofen 800 MG tablet Commonly known as: ADVIL Take 1 tablet (800 mg total) by mouth 3 (three) times daily.   indomethacin 50 MG capsule Commonly known as: INDOCIN Take 50 mg by mouth 3 (three) times daily.   Xarelto 20 MG Tabs tablet Generic drug: rivaroxaban SMARTSIG:1 Tablet(s) By Mouth Every Evening        Allergies:  No Known Allergies  Family History: History reviewed. No pertinent family history.  Social History:  reports that he has never smoked. He has never used smokeless tobacco. He reports current alcohol use. He reports that he does not currently use drugs.   Physical Exam: BP (!) 140/102   Pulse 61   Ht 6\' 9"  (2.057 m)   Wt (!) 320 lb (145.2 kg)   BMI 34.29 kg/m   Constitutional:  Alert and oriented, No acute distress. HEENT: [place] AT, moist mucus membranes.  Trachea midline, no masses. Cardiovascular: No clubbing, cyanosis, or edema. Respiratory: Normal respiratory effort, no increased work of breathing. Psychiatric: Normal mood and affect.  Laboratory Data:  Urinalysis Microscopy 11-30 WBC/many bacteria   Assessment & Plan:    1.  Left ureteral calculus/renal calculi We discussed various treatment options for urolithiasis including observation with or without medical expulsive therapy, shockwave lithotripsy (SWL), ureteroscopy and laser lithotripsy with stent placement. We discussed that management is based on stone size, location, density, patient co-morbidities, and patient preference.  SWL has a lower stone free rate in a single procedure, but also a lower complication rate compared to ureteroscopy and avoids a stent and associated stent related symptoms. Possible complications include renal hematoma, steinstrasse, and need for additional treatment. Ureteroscopy with laser lithotripsy and stent placement has a higher stone free rate  than SWL in a single procedure, however increased complication rate including possible infection, ureteral injury, bleeding, and stent related morbidity. Common stent related symptoms include dysuria, urgency/frequency, and flank pain. He would like to proceed with ureteroscopy.  We discussed in a small percentage of cases the calculi cannot be treated due to inability to access the ureter with ureteroscope.  If this were to occur he would need stent  placement and either follow-up ureteroscopy after a period of passive stent dilation or shockwave lithotripsy We will need clearance to hold Xarelto  2.  Recurrent UTI Recurrent E. coli bacteriuria.  Since not a typical urease producing organism treatment of his calculi may not resolve his infections He is symptomatic and urine culture was ordered. Rx amoxicillin 875 mg sent to pharmacy.  We will keep on low-dose prophylaxis after course complete until his surgery date  3.  Recurrent gross hematuria Most likely multifactorial-UTI, urinary tract calculi and anticoagulation Thorough cystoscopy will be performed at time of ureteroscopy    Riki Altes, MD  [facility] [address] [place], [place] [address] [phone]

## 2022-02-23 LAB — CULTURE, URINE COMPREHENSIVE

## 2022-02-26 ENCOUNTER — Other Ambulatory Visit: Payer: Self-pay | Admitting: Urology

## 2022-02-26 ENCOUNTER — Other Ambulatory Visit: Payer: Self-pay | Admitting: *Deleted

## 2022-02-26 ENCOUNTER — Telehealth: Payer: Self-pay | Admitting: *Deleted

## 2022-02-26 DIAGNOSIS — N200 Calculus of kidney: Secondary | ICD-10-CM

## 2022-02-26 DIAGNOSIS — N201 Calculus of ureter: Secondary | ICD-10-CM

## 2022-02-26 MED ORDER — AMOXICILLIN-POT CLAVULANATE 875-125 MG PO TABS: 1.0000 | ORAL_TABLET | Freq: Two times a day (BID) | ORAL | 0 refills | Status: DC

## 2022-02-26 NOTE — Telephone Encounter (Signed)
-----   Message from Riki Altes, MD sent at 02/26/2022  8:05 AM EDT ----- Urine culture was positive for infection.  Please send in Rx amoxicillin 875 mg twice daily x 10 days then once daily until surgery is performed.  Melissa will call to schedule.  Send in 30 tablets

## 2022-02-26 NOTE — Telephone Encounter (Signed)
Notified patient as instructed, patient pleased °

## 2022-02-26 NOTE — Progress Notes (Unsigned)
Surgical Physician Order Form [facility] [place]  * Scheduling expectation : Next Available  *Length of Case:   *Clearance needed: yes  *Anticoagulation Instructions: Hold all anticoagulants  *Aspirin Instructions: N/A  *Post-op visit Date/Instructions:  1 week cysto stent removal  *Diagnosis:  Left ureteral calculus; left nephrolithiasis  *Procedure:  Left  Ureteroscopy w/laser lithotripsy & stent placement (47829)   Additional orders: N/A  -Admit type: OUTpatient  -Anesthesia: General  -VTE Prophylaxis Standing Order SCD's       Other:   -Standing Lab Orders Per Anesthesia    Lab other: None  -Standing Test orders EKG/Chest x-ray per Anesthesia       Test other:   - Medications:   Ancef 3 g IV  -Other orders:   Urine culture was positive on 02/19/2022.  Rx amoxicillin sent to treat for 10 days and he was given 10 additional tablets to take daily to prevent recurrent infection prior to surgery.  If his surgery date is more than 20 days away we will need to send in more amoxicillin to take daily.  Thanks

## 2022-03-01 ENCOUNTER — Telehealth: Payer: Self-pay

## 2022-03-01 NOTE — Progress Notes (Signed)
REQUEST FOR SURGICAL CLEARANCE       Date: Date: 03/09/2022  Faxed to: [facility]  Surgeon: Dr. Irineo Axon, MD     Date of Surgery: 03/09/2022  Operation: Left Ureteroscopy with laser lithotripsy and stent placement  Anesthesia Type: General   Diagnosis: Left Nephrolithiasis, Left Ureteral Stone  Patient Requires:   Medical Clearance : Yes  Reason: Would like for patient to hold Xarelto prior to surgery   Risk Assessment:    Low   []       Moderate   []     High   []           This patient is optimized for surgery  YES []       NO   []    I recommend further assessment/workup prior to surgery. YES []      NO  []   Appointment scheduled for: _______________________   Further recommendations: ____________________________________     Physician Signature:__________________________________   Printed Name: ________________________________________   Date: _________________

## 2022-03-01 NOTE — Telephone Encounter (Signed)
I spoke with Herbert Marshall. We have discussed possible surgery dates and Tuesday August 22nd, 2023 was agreed upon by all parties. Patient given information about surgery date, what to expect pre-operatively and post operatively.  We discussed that a Pre-Admission Testing office will be calling to set up the pre-op visit that will take place prior to surgery, and that these appointments are typically done over the phone with a Pre-Admissions RN.  Informed patient that our office will communicate any additional care to be provided after surgery. Patients questions or concerns were discussed during our call. Advised to call our office should there be any additional information, questions or concerns that arise. Patient verbalized understanding.

## 2022-03-01 NOTE — Progress Notes (Addendum)
[facility] Posting Form   Surgery Date/Time: Date: 03/09/2022  Surgeon: Dr. Irineo Axon, MD  Surgery Location: Day Surgery  Inpt ( No  )   Outpt (Yes)   Obs ( No  )   Diagnosis: N20.1 Left Ureteral Stone, N20.0 Left Nephrolithiasis  -CPT: 21115  Surgery: Left Ureteroscopy with laser lithotripsy and stent placement  Stop Anticoagulations: Yes  Cardiac/Medical/Pulmonary Clearance needed: yes to hold Xarelto  Clearance needed from Dr: Carolyne Fiscal  Clearance request sent on: Date: 03/01/22   *Orders entered into EPIC  Date: 03/01/22   *Case booked in EPIC  Date: 02/26/2022  *Notified pt of Surgery: Date: 02/26/2022  PRE-OP UA & CX: no  *Placed into Prior Authorization Work [place] Date: 03/01/22   Assistant/laser/rep:No

## 2022-03-02 ENCOUNTER — Other Ambulatory Visit: Payer: Self-pay | Admitting: *Deleted

## 2022-03-02 ENCOUNTER — Telehealth: Payer: Self-pay

## 2022-03-02 NOTE — Telephone Encounter (Signed)
Spoke with Dr. Lonna Cobb, received clearance that patient may hold his Xarelto prior to surgery. Dr. Lonna Cobb advised that he would like it to be held for 3 days. Patient was advised and verbalized understanding of this.

## 2022-03-04 ENCOUNTER — Encounter
Admission: RE | Admit: 2022-03-04 | Discharge: 2022-03-04 | Disposition: A | Discharge status: Home / Self Care | Payer: Commercial Managed Care - HMO | Source: Ambulatory Visit | Attending: Urology | Admitting: Urology

## 2022-03-04 HISTORY — DX: Personal history of urinary calculi: Z87.442

## 2022-03-04 NOTE — Patient Instructions (Signed)
Your procedure is scheduled on: 03/09/22 Report to DAY SURGERY DEPARTMENT LOCATED ON 2ND FLOOR MEDICAL MALL ENTRANCE. To find out your arrival time please call [phone] between 1PM - 3PM on 03/08/22.  Remember: Instructions that are not followed completely may result in serious medical risk, up to and including death, or upon the discretion of your surgeon and anesthesiologist your surgery may need to be rescheduled.     _X__ 1. Do not eat food or drink any liquids after midnight the night before your procedure.                 No gum chewing or hard candies.   __X__2.  On the morning of surgery brush your teeth with toothpaste and water, you                 may rinse your mouth with mouthwash if you wish.  Do not swallow any              toothpaste of mouthwash.     _X__ 3.  No Alcohol for 24 hours before or after surgery.   _X__ 4.  Do Not Smoke or use e-cigarettes For 24 Hours Prior to Your Surgery.                 Do not use any chewable tobacco products for at least 6 hours prior to                 surgery.  ____  5.  Bring all medications with you on the day of surgery if instructed.   __X__  6.  Notify your doctor if there is any change in your medical condition      (cold, fever, infections).     Do not wear jewelry, make-up, hairpins, clips or nail polish. Do not wear lotions, powders, or perfumes.  Do not shave body hair 48 hours prior to surgery. Men may shave face and neck. Do not bring valuables to the hospital.    [facility] is not responsible for any belongings or valuables.  Contacts, dentures/partials or body piercings may not be worn into surgery. Bring a case for your contacts, glasses or hearing aids, a denture cup will be supplied. Leave your suitcase in the car. After surgery it may be brought to your room. For patients admitted to the hospital, discharge time is determined by your treatment team.   Patients discharged the day of surgery will not be  allowed to drive home.     __X__ Take these medicines the morning of surgery with A SIP OF WATER:    1. Colchicine 0.6 MG CAPS  2. febuxostat (ULORIC) 40 MG tablet  3.   4.  5.  6.  ____ Fleet Enema (as directed)   ____ Use CHG Soap/SAGE wipes as directed  ____ Use inhalers on the day of surgery  ____ Stop metformin/Janumet/Farxiga 2 days prior to surgery    ____ Take 1/2 of usual insulin dose the night before surgery. No insulin the morning          of surgery.   __X__ Stop Blood Thinners Xarelto 3 DAYS PRIOR TO SURGERY  __X__ Stop Anti-inflammatories 7 days before surgery such as Advil, Ibuprofen, Motrin,  BC or Goodies Powder, Naprosyn, Naproxen, Aleve, Aspirin    __X__ Stop all herbals and supplements, fish oil or vitamins E until after surgery.    ____ Bring C-Pap to the hospital.

## 2022-03-09 ENCOUNTER — Ambulatory Visit
Admission: RE | Admit: 2022-03-09 | Discharge: 2022-03-09 | Disposition: A | Discharge status: Home / Self Care | Payer: Commercial Managed Care - HMO | Source: Ambulatory Visit | Attending: Urology | Admitting: Urology

## 2022-03-09 ENCOUNTER — Ambulatory Visit: Payer: Commercial Managed Care - HMO | Admitting: Urgent Care

## 2022-03-09 ENCOUNTER — Encounter: Payer: Self-pay | Admitting: Urology

## 2022-03-09 ENCOUNTER — Encounter
Admission: RE | Disposition: A | Discharge status: Home / Self Care | Payer: Self-pay | Source: Ambulatory Visit | Attending: Urology

## 2022-03-09 ENCOUNTER — Other Ambulatory Visit: Payer: Self-pay

## 2022-03-09 ENCOUNTER — Ambulatory Visit: Payer: Commercial Managed Care - HMO

## 2022-03-09 DIAGNOSIS — N202 Calculus of kidney with calculus of ureter: Secondary | ICD-10-CM | POA: Diagnosis present

## 2022-03-09 DIAGNOSIS — N390 Urinary tract infection, site not specified: Secondary | ICD-10-CM | POA: Insufficient documentation

## 2022-03-09 DIAGNOSIS — N201 Calculus of ureter: Secondary | ICD-10-CM

## 2022-03-09 DIAGNOSIS — M109 Gout, unspecified: Secondary | ICD-10-CM | POA: Diagnosis not present

## 2022-03-09 DIAGNOSIS — Z7901 Long term (current) use of anticoagulants: Secondary | ICD-10-CM | POA: Diagnosis not present

## 2022-03-09 DIAGNOSIS — R310 Gross hematuria: Secondary | ICD-10-CM | POA: Diagnosis not present

## 2022-03-09 DIAGNOSIS — Z86718 Personal history of other venous thrombosis and embolism: Secondary | ICD-10-CM | POA: Diagnosis not present

## 2022-03-09 DIAGNOSIS — N200 Calculus of kidney: Secondary | ICD-10-CM

## 2022-03-09 HISTORY — PX: CYSTOSCOPY/URETEROSCOPY/HOLMIUM LASER/STENT PLACEMENT: SHX6546

## 2022-03-09 SURGERY — CYSTOSCOPY/URETEROSCOPY/HOLMIUM LASER/STENT PLACEMENT
Anesthesia: General | Site: Ureter | Laterality: Left

## 2022-03-09 MED ORDER — MIDAZOLAM HCL 2 MG/2ML IJ SOLN
INTRAMUSCULAR | Status: DC | PRN
  Administered 2022-03-09: 2 mg via INTRAVENOUS

## 2022-03-09 MED ORDER — PHENYLEPHRINE HCL (PRESSORS) 10 MG/ML IV SOLN
INTRAVENOUS | Status: DC | PRN
  Administered 2022-03-09 (×3): 160 ug via INTRAVENOUS

## 2022-03-09 MED ORDER — LIDOCAINE HCL (PF) 2 % IJ SOLN
INTRAMUSCULAR | Status: AC
  Filled 2022-03-09: qty 5

## 2022-03-09 MED ORDER — ONDANSETRON HCL 4 MG/2ML IJ SOLN
INTRAMUSCULAR | Status: AC
  Filled 2022-03-09: qty 2

## 2022-03-09 MED ORDER — OXYBUTYNIN CHLORIDE 5 MG PO TABS: ORAL_TABLET | ORAL | 0 refills | Status: AC

## 2022-03-09 MED ORDER — TAMSULOSIN HCL 0.4 MG PO CAPS: 0.4000 mg | ORAL_CAPSULE | Freq: Every day | ORAL | 0 refills | Status: AC

## 2022-03-09 MED ORDER — ACETAMINOPHEN 10 MG/ML IV SOLN
INTRAVENOUS | Status: DC | PRN
  Administered 2022-03-09: 1000 mg via INTRAVENOUS

## 2022-03-09 MED ORDER — ACETAMINOPHEN 10 MG/ML IV SOLN: 1000.0000 mg | Freq: Once | INTRAVENOUS | Status: DC | PRN

## 2022-03-09 MED ORDER — SUGAMMADEX SODIUM 500 MG/5ML IV SOLN
INTRAVENOUS | Status: DC | PRN
  Administered 2022-03-09: 300 mg via INTRAVENOUS

## 2022-03-09 MED ORDER — GLYCOPYRROLATE 0.2 MG/ML IJ SOLN
INTRAMUSCULAR | Status: DC | PRN
  Administered 2022-03-09: .2 mg via INTRAVENOUS

## 2022-03-09 MED ORDER — PROPOFOL 10 MG/ML IV BOLUS
INTRAVENOUS | Status: DC | PRN
  Administered 2022-03-09: 200 mg via INTRAVENOUS

## 2022-03-09 MED ORDER — DEXAMETHASONE SODIUM PHOSPHATE 10 MG/ML IJ SOLN
INTRAMUSCULAR | Status: AC
Start: 2022-03-09 — End: ?
  Filled 2022-03-09: qty 1

## 2022-03-09 MED ORDER — CHLORHEXIDINE GLUCONATE 0.12 % MT SOLN
OROMUCOSAL | Status: AC
  Administered 2022-03-09: 15 mL via OROMUCOSAL
  Filled 2022-03-09: qty 15

## 2022-03-09 MED ORDER — IOHEXOL 180 MG/ML  SOLN
INTRAMUSCULAR | Status: DC | PRN
  Administered 2022-03-09: 20 mL

## 2022-03-09 MED ORDER — OXYCODONE HCL 5 MG PO TABS: 5.0000 mg | ORAL_TABLET | Freq: Once | ORAL | Status: DC | PRN

## 2022-03-09 MED ORDER — ONDANSETRON HCL 4 MG/2ML IJ SOLN
INTRAMUSCULAR | Status: DC | PRN
  Administered 2022-03-09: 4 mg via INTRAVENOUS

## 2022-03-09 MED ORDER — PHENYLEPHRINE HCL-NACL 20-0.9 MG/250ML-% IV SOLN
INTRAVENOUS | Status: DC | PRN
  Administered 2022-03-09: 25 ug/min via INTRAVENOUS

## 2022-03-09 MED ORDER — FENTANYL CITRATE (PF) 100 MCG/2ML IJ SOLN
INTRAMUSCULAR | Status: AC
  Filled 2022-03-09: qty 2

## 2022-03-09 MED ORDER — EPHEDRINE SULFATE (PRESSORS) 50 MG/ML IJ SOLN
INTRAMUSCULAR | Status: DC | PRN
  Administered 2022-03-09: 10 mg via INTRAVENOUS
  Administered 2022-03-09: 7.5 mg via INTRAVENOUS

## 2022-03-09 MED ORDER — DEXAMETHASONE SODIUM PHOSPHATE 10 MG/ML IJ SOLN
INTRAMUSCULAR | Status: DC | PRN
  Administered 2022-03-09: 5 mg via INTRAVENOUS

## 2022-03-09 MED ORDER — FAMOTIDINE 20 MG PO TABS
ORAL_TABLET | ORAL | Status: AC
  Administered 2022-03-09: 20 mg via ORAL
  Filled 2022-03-09: qty 1

## 2022-03-09 MED ORDER — LACTATED RINGERS IV SOLN: INTRAVENOUS | Status: DC

## 2022-03-09 MED ORDER — ACETAMINOPHEN 10 MG/ML IV SOLN
INTRAVENOUS | Status: AC
  Filled 2022-03-09: qty 100

## 2022-03-09 MED ORDER — CEFAZOLIN IN SODIUM CHLORIDE 3-0.9 GM/100ML-% IV SOLN
3.0000 g | INTRAVENOUS | Status: AC
  Administered 2022-03-09: 3 g via INTRAVENOUS
  Filled 2022-03-09: qty 100

## 2022-03-09 MED ORDER — OXYCODONE-ACETAMINOPHEN 5-325 MG PO TABS: 1.0000 | ORAL_TABLET | Freq: Four times a day (QID) | ORAL | 0 refills | Status: DC | PRN

## 2022-03-09 MED ORDER — OXYCODONE HCL 5 MG/5ML PO SOLN: 5.0000 mg | Freq: Once | ORAL | Status: DC | PRN

## 2022-03-09 MED ORDER — ONDANSETRON HCL 4 MG/2ML IJ SOLN: 4.0000 mg | Freq: Once | INTRAMUSCULAR | Status: DC | PRN

## 2022-03-09 MED ORDER — FENTANYL CITRATE (PF) 100 MCG/2ML IJ SOLN: 25.0000 ug | INTRAMUSCULAR | Status: DC | PRN

## 2022-03-09 MED ORDER — SODIUM CHLORIDE 0.9 % IR SOLN
Status: DC | PRN
  Administered 2022-03-09: 3000 mL via INTRAVESICAL

## 2022-03-09 MED ORDER — PROPOFOL 10 MG/ML IV BOLUS
INTRAVENOUS | Status: AC
  Filled 2022-03-09: qty 60

## 2022-03-09 MED ORDER — LIDOCAINE HCL (CARDIAC) PF 100 MG/5ML IV SOSY
PREFILLED_SYRINGE | INTRAVENOUS | Status: DC | PRN
  Administered 2022-03-09: 100 mg via INTRAVENOUS

## 2022-03-09 MED ORDER — FAMOTIDINE 20 MG PO TABS: 20.0000 mg | ORAL_TABLET | Freq: Once | ORAL | Status: AC

## 2022-03-09 MED ORDER — CHLORHEXIDINE GLUCONATE 0.12 % MT SOLN: 15.0000 mL | Freq: Once | OROMUCOSAL | Status: AC

## 2022-03-09 MED ORDER — FENTANYL CITRATE (PF) 100 MCG/2ML IJ SOLN
INTRAMUSCULAR | Status: DC | PRN
Start: 2022-03-09 — End: 2022-03-09
  Administered 2022-03-09 (×2): 50 ug via INTRAVENOUS

## 2022-03-09 MED ORDER — SUGAMMADEX SODIUM 500 MG/5ML IV SOLN
INTRAVENOUS | Status: AC
  Filled 2022-03-09: qty 5

## 2022-03-09 MED ORDER — ORAL CARE MOUTH RINSE: 15.0000 mL | Freq: Once | OROMUCOSAL | Status: AC

## 2022-03-09 MED ORDER — ROCURONIUM BROMIDE 100 MG/10ML IV SOLN
INTRAVENOUS | Status: DC | PRN
  Administered 2022-03-09: 70 mg via INTRAVENOUS
  Administered 2022-03-09: 10 mg via INTRAVENOUS
  Administered 2022-03-09: 30 mg via INTRAVENOUS
  Administered 2022-03-09: 10 mg via INTRAVENOUS

## 2022-03-09 MED ORDER — MIDAZOLAM HCL 2 MG/2ML IJ SOLN
INTRAMUSCULAR | Status: AC
  Filled 2022-03-09: qty 2

## 2022-03-09 MED ORDER — KETOROLAC TROMETHAMINE 30 MG/ML IJ SOLN
INTRAMUSCULAR | Status: DC | PRN
  Administered 2022-03-09: 30 mg via INTRAVENOUS

## 2022-03-09 SURGICAL SUPPLY — 26 items
BAG DRAIN SIEMENS DORNER NS (MISCELLANEOUS) ×1 IMPLANT
BAG DRN NS LF (MISCELLANEOUS) ×1
BASKET ZERO TIP 1.9FR (BASKET) IMPLANT
BRUSH SCRUB EZ 1% IODOPHOR (MISCELLANEOUS) ×1 IMPLANT
BSKT STON RTRVL ZERO TP 1.9FR (BASKET) ×1
CATH URETL OPEN END 6FR 70 (CATHETERS) IMPLANT
CNTNR SPEC 2.5X3XGRAD LEK (MISCELLANEOUS)
CONT SPEC 4OZ STER OR WHT (MISCELLANEOUS)
CONT SPEC 4OZ STRL OR WHT (MISCELLANEOUS)
CONTAINER SPEC 2.5X3XGRAD LEK (MISCELLANEOUS) IMPLANT
DRAPE UTILITY 15X26 TOWEL STRL (DRAPES) ×1 IMPLANT
FIBER LASER MOSES 200 DFL (Laser) IMPLANT
GLOVE SURG UNDER POLY LF SZ7.5 (GLOVE) ×1 IMPLANT
GOWN STRL REUS W/ TWL LRG LVL3 (GOWN DISPOSABLE) ×1 IMPLANT
GOWN STRL REUS W/ TWL XL LVL3 (GOWN DISPOSABLE) ×1 IMPLANT
GOWN STRL REUS W/TWL LRG LVL3 (GOWN DISPOSABLE) ×1
GOWN STRL REUS W/TWL XL LVL3 (GOWN DISPOSABLE) ×1
GUIDEWIRE STR DUAL SENSOR (WIRE) ×1 IMPLANT
IV NS IRRIG 3000ML ARTHROMATIC (IV SOLUTION) ×1 IMPLANT
KIT TURNOVER CYSTO (KITS) ×1 IMPLANT
PACK CYSTO AR (MISCELLANEOUS) ×1 IMPLANT
SET CYSTO W/LG BORE CLAMP LF (SET/KITS/TRAYS/PACK) ×1 IMPLANT
STENT URET 6FRX30 CONTOUR (STENTS) IMPLANT
SURGILUBE 2OZ TUBE FLIPTOP (MISCELLANEOUS) ×1 IMPLANT
VALVE UROSEAL ADJ ENDO (VALVE) IMPLANT
WATER STERILE IRR 500ML POUR (IV SOLUTION) ×1 IMPLANT

## 2022-03-09 NOTE — Anesthesia Procedure Notes (Signed)
Procedure Name: Intubation Date/Time: 03/09/2022 10:43 AM  Performed by: Henrietta Hoover, CRNAPre-anesthesia Checklist: Patient identified, Emergency Drugs available, Suction available and Patient being monitored Patient Re-evaluated:Patient Re-evaluated prior to induction Oxygen Delivery Method: Circle system utilized Preoxygenation: Pre-oxygenation with 100% oxygen Induction Type: IV induction Ventilation: Mask ventilation without difficulty Laryngoscope Size: McGraph and 4 Grade View: Grade I Tube type: Oral Tube size: 8.0 mm Number of attempts: 1 Airway Equipment and Method: Stylet and Video-laryngoscopy Placement Confirmation: ETT inserted through vocal cords under direct vision, positive ETCO2 and breath sounds checked- equal and bilateral Secured at: 24 cm Tube secured with: Tape Dental Injury: Teeth and Oropharynx as per pre-operative assessment

## 2022-03-09 NOTE — Discharge Instructions (Addendum)
DISCHARGE INSTRUCTIONS FOR KIDNEY STONE/URETERAL STENT   MEDICATIONS:  1. Resume all your other meds from home.  2.  AZO (over-the-counter) can help with the burning/stinging when you urinate. 3.  Oxycodone is for moderate/severe pain, Rx was sent to your pharmacy. 4.  Tamsulosin and oxybutynin are for bladder irritation/spasm.  Rxs were sent to your pharmacy 5.  You may resume Xarelto 03/10/2022  ACTIVITY:  1. May resume regular activities in 24 hours. 2. No driving while on narcotic pain medications  3. Drink plenty of water  4. Continue to walk at home - you can still get blood clots when you are at home, so keep active, but don't over do it.  5. May return to work/school tomorrow or when you feel ready    SIGNS/SYMPTOMS TO CALL:  Common postoperative symptoms include urinary frequency, urgency, bladder spasm and blood in the urine  Please call us if you have a fever greater than 101.5, uncontrolled nausea/vomiting, uncontrolled pain, dizziness, unable to urinate, excessively bloody urine, chest pain, shortness of breath, leg swelling, leg pain, or any other concerns or questions.   You can reach [place] at [phone].   FOLLOW-UP:  1. You have a follow-up appointment scheduled 03/18/2022 for stent removal  AMBULATORY SURGERY  DISCHARGE INSTRUCTIONS   The drugs that you were given will stay in your system until tomorrow so for the next 24 hours you should not:  Drive an automobile Make any legal decisions Drink any alcoholic beverage   You may resume regular meals tomorrow.  Today it is better to start with liquids and gradually work up to solid foods.  You may eat anything you prefer, but it is better to start with liquids, then soup and crackers, and gradually work up to solid foods.   Please notify your doctor immediately if you have any unusual bleeding, trouble breathing, redness and pain at the surgery site, drainage, fever, or pain not relieved by  medication.    Additional Instructions:    Please contact your physician with any problems or Same Day Surgery at [phone], Monday through Friday 6 am to 4 pm, or [facility] number at [phone].

## 2022-03-09 NOTE — Op Note (Signed)
Preoperative diagnosis:  Left distal ureteral calculus Left nephrolithiasis  Postoperative diagnosis:  Same  Procedure:  Cystoscopy Left ureteroscopy and stone removal Ureteroscopic laser lithotripsy Left ureteral stent placement (63F/30 cm)  Left retrograde pyelography with interpretation Intraoperative fluoroscopy < 30 min   Surgeon: Lorin Picket C. Katarina Riebe, M.D.  Anesthesia: General  Complications: None  Intraoperative findings:  Cystoscopy-urethra normal in caliber without stricture.  Prostate with prominent lateral lobe enlargement and moderate median lobe.  Mild bladder trabeculation.  Mucosal erythema, solid or papillary lesions.  UOs normal-appearing bilaterally. Left ureteropyeloscopy-6 mm left distal ureteral calculus.  Marked dilation of the distal ureter.  2 left lower calyceal calculi largest measuring ~ 6 mm Retrograde pyelogram post procedure showed no contrast extravasation with some pyelovenous backflow.   EBL: Minimal  Specimens: Calculus fragments for analysis   Indication: Herbert Marshall is a 56 y.o. male with recurrent gross hematuria and UTI.  CT urogram showed a 6 mm nonobstructing distal ureteral calculus with marked dilation of the distal ureter and moderate hydroureter without hydronephrosis.  Nonobstructing lower calyceal calculi were noted.  After reviewing the management options for treatment, the patient elected to proceed with the above surgical procedure(s). We have discussed the potential benefits and risks of the procedure, side effects of the proposed treatment, the likelihood of the patient achieving the goals of the procedure, and any potential problems that might occur during the procedure or recuperation. Informed consent has been obtained.  Description of procedure:  The patient was taken to the operating room and general anesthesia was induced.  The patient was placed in the dorsal lithotomy position, prepped and draped in the usual sterile  fashion, and preoperative antibiotics were administered. A preoperative time-out was performed.   A 21 French cystoscope was lubricated, passed per urethra and advanced proximally under direct vision with findings as described above.  Attention was directed to the left ureteral orifice and a 0.038 Sensor wire was then advanced up the  ureter into the renal pelvis under fluoroscopic guidance.  A 4.5 Fr semirigid ureteroscope was then advanced into the ureter next to the guidewire and the calculus was identified.  The stone was then fragmented with a 200 m Moses holmium laser fiber on a setting of 0.3J/40 Hz.  All fragments were then removed from the ureter with a zero tip nitinol basket.  Reinspection of the ureter revealed no remaining visible stones or fragments.   Retrograde pyelogram was performed with findings as described above.  The semirigid ureteroscope was removed and a single channel digital flexible ureteroscope was passed per urethra into the bladder.  The ureteroscope was inserted into the left ureteral orifice alongside the guidewire without difficulty and advanced proximally.  Pyeloscopy was performed with findings described above.  A 4 mm lower calyceal calculus was placed in the basket and removed.  The ureteroscope was repassed.  Laser lithotripsy was performed of the 6 mm calyceal calculus however it migrated into a more inferior calyx which was slightly anterior.  The calculus could not be completely treated due to scope deflection and was unable to be relocated with a basket.  Retrograde pyelogram was performed through the ureteroscope with findings as described above.  All calyces were examined and no fragments were identified with the exception of the incompletely treated lower calyceal calculus.  The ureteroscope was removed under direct vision and no ureteral fragments were noted.  A 63F/30 cm Contour ureteral stent was placed under fluoroscopic guidance.  The wire was then  removed with  an adequate stent curl noted in the renal pelvis as well as in the bladder.  The bladder was then emptied and the procedure ended.  The patient appeared to tolerate the procedure well and without complications.  After anesthetic reversal the patient was transported to the PACU in stable condition.   Plan: Office follow-up 03/18/2022 for cystoscopy with stent removal 6   Irineo Axon, MD

## 2022-03-09 NOTE — Anesthesia Postprocedure Evaluation (Signed)
Anesthesia Post Note  Patient: Herbert Marshall  Procedure(s) Performed: CYSTOSCOPY/URETEROSCOPY/HOLMIUM LASER/STENT PLACEMENT (Left: Ureter)  Patient location during evaluation: PACU Anesthesia Type: General Level of consciousness: awake and alert Pain management: pain level controlled Vital Signs Assessment: post-procedure vital signs reviewed and stable Respiratory status: spontaneous breathing, nonlabored ventilation, respiratory function stable and patient connected to nasal cannula oxygen Cardiovascular status: blood pressure returned to baseline and stable Postop Assessment: no apparent nausea or vomiting Anesthetic complications: no   No notable events documented.   Last Vitals:  Vitals:   03/09/22 1315 03/09/22 1324  BP: 125/80 (!) 145/84  Pulse: 70 73  Resp: 14 20  Temp:  (!) 36.1 C  SpO2: 98% 96%    Last Pain:  Vitals:   03/09/22 1324  TempSrc: Temporal  PainSc: 0-No pain                 Corinda Gubler

## 2022-03-09 NOTE — Interval H&P Note (Signed)
History and Physical Interval Note:  CV:RRR Lungs:clear  03/09/2022 10:27 AM  Herbert Marshall  has presented today for surgery, with the diagnosis of Left Ureteral Calculus, Left Nephrolithiasis.  The various methods of treatment have been discussed with the patient and family. After consideration of risks, benefits and other options for treatment, the patient has consented to  Procedure(s): CYSTOSCOPY/URETEROSCOPY/HOLMIUM LASER/STENT PLACEMENT (Left) as a surgical intervention.  The patient's history has been reviewed, patient examined, no change in status, stable for surgery.  I have reviewed the patient's chart and labs.  Questions were answered to the patient's satisfaction.     [name] C Maxx Pham

## 2022-03-09 NOTE — Transfer of Care (Signed)
Immediate Anesthesia Transfer of Care Note  Patient: Herbert Marshall  Procedure(s) Performed: CYSTOSCOPY/URETEROSCOPY/HOLMIUM LASER/STENT PLACEMENT (Left: Ureter)  Patient Location: PACU  Anesthesia Type:General  Level of Consciousness: awake, drowsy and patient cooperative  Airway & Oxygen Therapy: Patient Spontanous Breathing and Patient connected to face mask oxygen  Post-op Assessment: Report given to RN and Post -op Vital signs reviewed and stable  Post vital signs: Reviewed and stable -- please see vitals in PACU flowsheet.  Last Vitals:  Vitals Value Taken Time  BP    Temp    Pulse    Resp    SpO2      Last Pain:  Vitals:   03/09/22 0901  TempSrc: Temporal  PainSc: 0-No pain         Complications: No notable events documented.

## 2022-03-09 NOTE — Anesthesia Preprocedure Evaluation (Signed)
Anesthesia Evaluation  Patient identified by MRN, date of birth, ID band Patient awake    Reviewed: Allergy & Precautions, NPO status , Patient's Chart, lab work & pertinent test results  History of Anesthesia Complications Negative for: history of anesthetic complications  Airway Mallampati: III  TM Distance: >3 FB Neck ROM: Full    Dental no notable dental hx. (+) Teeth Intact   Pulmonary neg pulmonary ROS, neg sleep apnea, neg COPD, Patient abstained from smoking.Not current smoker,    Pulmonary exam normal breath sounds clear to auscultation       Cardiovascular Exercise Tolerance: Good METS(-) hypertension+ DVT  (-) CAD and (-) Past MI (-) dysrhythmias  Rhythm:Regular Rate:Normal - Systolic murmurs    Neuro/Psych negative neurological ROS  negative psych ROS   GI/Hepatic neg GERD  ,(+)     (-) substance abuse  ,   Endo/Other  neg diabetes  Renal/GU negative Renal ROS     Musculoskeletal   Abdominal   Peds  Hematology   Anesthesia Other Findings Past Medical History: No date: Blood clot in vein     Comment:  right leg No date: Gout No date: Hemorrhoid No date: History of kidney stones  Reproductive/Obstetrics                             Anesthesia Physical Anesthesia Plan  ASA: 2  Anesthesia Plan: General   Post-op Pain Management: Ofirmev IV (intra-op)*   Induction: Intravenous  PONV Risk Score and Plan: 3 and Ondansetron, Dexamethasone and Midazolam  Airway Management Planned: Oral ETT and Video Laryngoscope Planned  Additional Equipment: None  Intra-op Plan:   Post-operative Plan: Extubation in OR  Informed Consent: I have reviewed the patients History and Physical, chart, labs and discussed the procedure including the risks, benefits and alternatives for the proposed anesthesia with the patient or authorized representative who has indicated his/her  understanding and acceptance.     Dental advisory given  Plan Discussed with: CRNA and Surgeon  Anesthesia Plan Comments: (Discussed risks of anesthesia with patient, including PONV, sore throat, lip/dental/eye damage. Rare risks discussed as well, such as cardiorespiratory and neurological sequelae, and allergic reactions. Discussed the role of CRNA in patient's perioperative care. Patient understands.)        Anesthesia Quick Evaluation

## 2022-03-10 ENCOUNTER — Encounter: Payer: Self-pay | Admitting: Urology

## 2022-03-11 ENCOUNTER — Other Ambulatory Visit: Payer: Self-pay | Admitting: Urology

## 2022-03-15 LAB — CALCULI, WITH PHOTOGRAPH (CLINICAL LAB)
Calcium Oxalate Monohydrate: 100 %
Weight Calculi: 64 mg

## 2022-03-18 ENCOUNTER — Ambulatory Visit (INDEPENDENT_AMBULATORY_CARE_PROVIDER_SITE_OTHER): Payer: Commercial Managed Care - HMO | Admitting: Urology

## 2022-03-18 ENCOUNTER — Encounter: Payer: Self-pay | Admitting: Urology

## 2022-03-18 VITALS — BP 153/94 | HR 69 | Ht >= 80 in | Wt 320.0 lb

## 2022-03-18 DIAGNOSIS — Z466 Encounter for fitting and adjustment of urinary device: Secondary | ICD-10-CM

## 2022-03-18 DIAGNOSIS — R310 Gross hematuria: Secondary | ICD-10-CM

## 2022-03-18 LAB — URINALYSIS, COMPLETE

## 2022-03-18 LAB — MICROSCOPIC EXAMINATION
Bacteria, UA: NONE SEEN
RBC, Urine: >30 /hpf — AB (ref 0–2)

## 2022-03-18 MED ORDER — AMPICILLIN 500 MG PO CAPS: 500.0000 mg | ORAL_CAPSULE | Freq: Every day | ORAL | 0 refills | Status: DC

## 2022-03-18 NOTE — Progress Notes (Signed)
Indications: Patient is 56 y.o., who is s/p ureteroscopic removal of a left ureteral and renal calculi 03/09/2022.  The patient is presenting today for stent removal.  Has had gross hematuria since the procedure but no orthostatic symptoms.  Is back on Xarelto  Procedure:  Flexible Cystoscopy with stent removal (23762)  Timeout was performed and the correct patient, procedure and participants were identified.    Description:  The patient was prepped and draped in the usual sterile fashion. Flexible cystosopy was performed.  The stent was visualized, grasped, and removed intact without difficulty. The patient tolerated the procedure well.  He is on low-dose antibiotic therapy  Complications:  None  Plan:  Stone analysis 100% calcium oxalate monohydrate 1 month follow-up for repeat UA Continue low-dose antibiotic therapy until follow-up-refill sent Call for worsening hematuria or fever/flank pain   Irineo Axon, MD

## 2022-04-15 ENCOUNTER — Encounter: Payer: Self-pay | Admitting: Urology

## 2022-04-15 ENCOUNTER — Ambulatory Visit: Payer: Commercial Managed Care - HMO | Admitting: Urology

## 2022-04-15 VITALS — BP 158/96 | HR 72 | Ht >= 80 in | Wt 325.0 lb

## 2022-04-15 DIAGNOSIS — Z87442 Personal history of urinary calculi: Secondary | ICD-10-CM | POA: Diagnosis not present

## 2022-04-15 DIAGNOSIS — Z87898 Personal history of other specified conditions: Secondary | ICD-10-CM

## 2022-04-15 LAB — URINALYSIS, COMPLETE
Bilirubin, UA: NEGATIVE
Glucose, UA: NEGATIVE
Ketones, UA: NEGATIVE
Leukocytes,UA: NEGATIVE
Nitrite, UA: NEGATIVE
Protein,UA: NEGATIVE
RBC, UA: NEGATIVE
Specific Gravity, UA: 1.02 (ref 1.005–1.030)
Urobilinogen, Ur: 0.2 mg/dL (ref 0.2–1.0)
pH, UA: 6 (ref 5.0–7.5)

## 2022-04-15 LAB — MICROSCOPIC EXAMINATION: Bacteria, UA: NONE SEEN

## 2022-04-15 NOTE — Progress Notes (Signed)
   04/15/2022 9:12 AM   Herbert Marshall Jan 06, 1966 [id number]  Referring provider: [facility], [address] [place],  [place] [address]  Chief Complaint  Patient presents with   Follow-up    HPI: 56 y.o. male presents for postop follow-up.  History of gross hematuria and recurrent UTI. CT remarkable for a left ureteral and renal calculi.  Status post ureteroscopic removal 03/09/2022.  Stent removed 03/18/2022 No problems after stent removal and his hematuria resolved No complaints today Stone analysis 100% calcium oxalate monohydrate   PMH: Past Medical History:  Diagnosis Date   Blood clot in vein    right leg   Gout    Hemorrhoid    History of kidney stones     Surgical History: Past Surgical History:  Procedure Laterality Date   ANKLE SURGERY Left    CYSTOSCOPY/URETEROSCOPY/HOLMIUM LASER/STENT PLACEMENT Left 03/09/2022   Procedure: CYSTOSCOPY/URETEROSCOPY/HOLMIUM LASER/STENT PLACEMENT;  Surgeon: Abbie Sons, MD;  Location: [facility] ORS;  Service: Urology;  Laterality: Left;   HEMORRHOID SURGERY     KNEE SURGERY Right     Home Medications:  Allergies as of 04/15/2022   No Known Allergies      Medication List        Accurate as of April 15, 2022  9:12 AM. If you have any questions, ask your nurse or doctor.          STOP taking these medications    ampicillin 500 MG capsule Commonly known as: PRINCIPEN Stopped by: Abbie Sons, MD   oxyCODONE-acetaminophen 5-325 MG tablet Commonly known as: PERCOCET/ROXICET Stopped by: Abbie Sons, MD       TAKE these medications    Colchicine 0.6 MG Caps Take 1 capsule by mouth daily.   cyclobenzaprine 10 MG tablet Commonly known as: FLEXERIL Take 1 tablet (10 mg total) by mouth 2 (two) times daily as needed for muscle spasms.   febuxostat 40 MG tablet Commonly known as: ULORIC Take 40 mg by mouth daily.   oxybutynin 5 MG tablet Commonly known as: DITROPAN 1 tab tid prn  frequency,urgency, bladder spasm   tamsulosin 0.4 MG Caps capsule Commonly known as: FLOMAX Take 1 capsule (0.4 mg total) by mouth daily after breakfast.   Xarelto 20 MG Tabs tablet Generic drug: rivaroxaban SMARTSIG:1 Tablet(s) By Mouth Every Evening        Allergies: No Known Allergies  Family History: History reviewed. No pertinent family history.  Social History:  reports that he has never smoked. He has never used smokeless tobacco. He reports current alcohol use of about 10.0 standard drinks of alcohol per week. He reports that he does not currently use drugs.   Physical Exam: BP (!) 158/96   Pulse 72   Ht 6\' 9"  (2.057 m)   Wt (!) 325 lb (147.4 kg)   BMI 34.83 kg/m   Constitutional:  Alert and oriented, No acute distress. Psychiatric: Normal mood and affect.  Laboratory Data:  Urinalysis Dipstick/microscopy negative   Assessment & Plan:   Doing well status post ureteroscopic stone removal. Hematuria has resolved and UA today clear We discussed metabolic stone evaluation and he has elected general stone prevention guidelines He was provided stone prevention literature Follow-up 6 months with [facility] or earlier for UTI symptoms or recurrent hematuria   Abbie Sons, MD  [facility] [address], [place], [place] [address] [phone]

## 2022-10-14 ENCOUNTER — Ambulatory Visit: Payer: Commercial Managed Care - HMO | Admitting: Urology
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
